# Patient Record
Sex: Female | Born: 1970 | Race: Black or African American | Hispanic: No | Marital: Married | Smoking: Never smoker
Health system: Southern US, Community
[De-identification: ages and names within clinical notes are randomized; demographics above are authoritative.]

## PROBLEM LIST (undated history)

## (undated) DIAGNOSIS — I10 Essential (primary) hypertension: Secondary | ICD-10-CM

## (undated) DIAGNOSIS — E785 Hyperlipidemia, unspecified: Secondary | ICD-10-CM

## (undated) HISTORY — DX: Hyperlipidemia, unspecified: E78.5

## (undated) HISTORY — PX: TUBAL LIGATION: SHX77

## (undated) HISTORY — DX: Essential (primary) hypertension: I10

---

## 1997-04-23 ENCOUNTER — Inpatient Hospital Stay (HOSPITAL_COMMUNITY): Admission: AD | Admit: 1997-04-23 | Discharge: 1997-04-23 | Payer: Self-pay | Admitting: Gynecology

## 1997-05-05 ENCOUNTER — Inpatient Hospital Stay (HOSPITAL_COMMUNITY): Admission: AD | Admit: 1997-05-05 | Discharge: 1997-05-05 | Payer: Self-pay | Admitting: Obstetrics and Gynecology

## 1997-05-07 ENCOUNTER — Inpatient Hospital Stay (HOSPITAL_COMMUNITY): Admission: AD | Admit: 1997-05-07 | Discharge: 1997-05-07 | Payer: Self-pay | Admitting: Obstetrics and Gynecology

## 1997-05-08 ENCOUNTER — Inpatient Hospital Stay (HOSPITAL_COMMUNITY): Admission: AD | Admit: 1997-05-08 | Discharge: 1997-05-10 | Payer: Self-pay | Admitting: Obstetrics and Gynecology

## 1998-01-20 ENCOUNTER — Emergency Department (HOSPITAL_COMMUNITY): Admission: EM | Admit: 1998-01-20 | Discharge: 1998-01-20 | Payer: Self-pay | Admitting: Internal Medicine

## 1998-01-20 ENCOUNTER — Encounter: Payer: Self-pay | Admitting: Internal Medicine

## 1999-01-27 ENCOUNTER — Emergency Department (HOSPITAL_COMMUNITY): Admission: EM | Admit: 1999-01-27 | Discharge: 1999-01-28 | Payer: Self-pay | Admitting: Emergency Medicine

## 1999-01-28 ENCOUNTER — Encounter: Payer: Self-pay | Admitting: Emergency Medicine

## 1999-07-01 ENCOUNTER — Inpatient Hospital Stay (HOSPITAL_COMMUNITY): Admission: AD | Admit: 1999-07-01 | Discharge: 1999-07-03 | Payer: Self-pay | Admitting: Gynecology

## 2000-07-18 ENCOUNTER — Encounter: Payer: Self-pay | Admitting: Emergency Medicine

## 2000-07-18 ENCOUNTER — Encounter: Admission: RE | Admit: 2000-07-18 | Discharge: 2000-07-18 | Payer: Self-pay | Admitting: Emergency Medicine

## 2001-02-02 ENCOUNTER — Emergency Department (HOSPITAL_COMMUNITY): Admission: EM | Admit: 2001-02-02 | Discharge: 2001-02-02 | Payer: Self-pay | Admitting: Emergency Medicine

## 2003-11-25 ENCOUNTER — Ambulatory Visit (HOSPITAL_COMMUNITY): Admission: RE | Admit: 2003-11-25 | Discharge: 2003-11-25 | Payer: Self-pay | Admitting: Obstetrics and Gynecology

## 2004-03-27 ENCOUNTER — Inpatient Hospital Stay (HOSPITAL_COMMUNITY): Admission: AD | Admit: 2004-03-27 | Discharge: 2004-03-29 | Payer: Self-pay | Admitting: Obstetrics and Gynecology

## 2013-03-03 ENCOUNTER — Other Ambulatory Visit: Payer: Self-pay | Admitting: Physician Assistant

## 2013-03-03 DIAGNOSIS — Z1231 Encounter for screening mammogram for malignant neoplasm of breast: Secondary | ICD-10-CM

## 2013-04-12 ENCOUNTER — Ambulatory Visit
Admission: RE | Admit: 2013-04-12 | Discharge: 2013-04-12 | Disposition: A | Discharge status: Home / Self Care | Payer: 59 | Source: Ambulatory Visit | Attending: Physician Assistant | Admitting: Physician Assistant

## 2013-04-12 DIAGNOSIS — Z1231 Encounter for screening mammogram for malignant neoplasm of breast: Secondary | ICD-10-CM

## 2014-03-21 ENCOUNTER — Other Ambulatory Visit: Payer: Self-pay

## 2014-03-21 DIAGNOSIS — Z1231 Encounter for screening mammogram for malignant neoplasm of breast: Secondary | ICD-10-CM

## 2014-04-13 ENCOUNTER — Ambulatory Visit
Admission: RE | Admit: 2014-04-13 | Discharge: 2014-04-13 | Disposition: A | Discharge status: Home / Self Care | Payer: 59 | Source: Ambulatory Visit

## 2014-04-13 DIAGNOSIS — Z1231 Encounter for screening mammogram for malignant neoplasm of breast: Secondary | ICD-10-CM

## 2015-03-19 HISTORY — PX: MOUTH SURGERY: SHX715

## 2015-03-30 ENCOUNTER — Encounter: Payer: 59 | Admitting: Neurology

## 2015-04-04 ENCOUNTER — Other Ambulatory Visit: Payer: Self-pay

## 2015-04-04 DIAGNOSIS — Z1231 Encounter for screening mammogram for malignant neoplasm of breast: Secondary | ICD-10-CM

## 2015-04-17 ENCOUNTER — Ambulatory Visit: Payer: Self-pay

## 2015-05-01 ENCOUNTER — Ambulatory Visit
Admission: RE | Admit: 2015-05-01 | Discharge: 2015-05-01 | Disposition: A | Discharge status: Home / Self Care | Payer: 59 | Source: Ambulatory Visit

## 2015-05-01 DIAGNOSIS — Z1231 Encounter for screening mammogram for malignant neoplasm of breast: Secondary | ICD-10-CM

## 2015-12-27 ENCOUNTER — Ambulatory Visit (INDEPENDENT_AMBULATORY_CARE_PROVIDER_SITE_OTHER): Payer: 59 | Admitting: Neurology

## 2015-12-27 ENCOUNTER — Encounter (INDEPENDENT_AMBULATORY_CARE_PROVIDER_SITE_OTHER): Payer: Self-pay | Admitting: Neurology

## 2015-12-27 DIAGNOSIS — Z0289 Encounter for other administrative examinations: Secondary | ICD-10-CM

## 2015-12-27 DIAGNOSIS — G5600 Carpal tunnel syndrome, unspecified upper limb: Secondary | ICD-10-CM | POA: Insufficient documentation

## 2015-12-27 DIAGNOSIS — G5603 Carpal tunnel syndrome, bilateral upper limbs: Secondary | ICD-10-CM | POA: Diagnosis not present

## 2015-12-27 NOTE — Procedures (Signed)
   NCS (NERVE CONDUCTION STUDY) WITH EMG (ELECTROMYOGRAPHY) REPORT   STUDY DATE: October 11th 2017 PATIENT NAME: Dorothy Frey DOB: Jan 10, 1971 MRN: [id number]    TECHNOLOGIST: Gearldine ShownLorraine Jones ELECTROMYOGRAPHER: Levert FeinsteinYan, Euleta Belson M.D.  CLINICAL INFORMATION:  45 years old right-handed female presented with 2 years history of progressive worsening intermittent bilateral hands paresthesia.  FINDINGS: NERVE CONDUCTION STUDY: Bilateral ulnar sensory and motor responses were normal. Bilateral median sensory responses showed moderately prolonged peak latency was well preserved snap amplitude, right worse than left.  Bilateral median motor responses showed mild to moderately prolonged distal latency, with normal C map amplitude, conduction velocity.  NEEDLE ELECTROMYOGRAPHY: Selective needle examinations were performed at right upper extremity muscles right cervical paraspinal muscles.  Needle examination of right pronator teres, biceps, triceps, deltoid was normal.  There was no spontaneous activity at right cervical paraspinal muscles, right C5-6 and 7.  IMPRESSION:   This is an abnormal study, there is electrodiagnostic evidence of median neuropathy across the wrist consistent with moderate bilateral carpal tunnel syndromes. There is no evidence of right cervical radiculopathy.    INTERPRETING PHYSICIAN:   Levert FeinsteinYan, Eliot Popper M.D. Ph.D. [facility] [address], [address] [place], [place] [address] [phone]

## 2016-01-04 ENCOUNTER — Encounter: Payer: 59 | Admitting: Neurology

## 2016-04-02 ENCOUNTER — Other Ambulatory Visit: Payer: Self-pay | Admitting: Physician Assistant

## 2016-04-02 DIAGNOSIS — Z1231 Encounter for screening mammogram for malignant neoplasm of breast: Secondary | ICD-10-CM

## 2016-05-21 ENCOUNTER — Ambulatory Visit
Admission: RE | Admit: 2016-05-21 | Discharge: 2016-05-21 | Disposition: A | Discharge status: Home / Self Care | Payer: 59 | Source: Ambulatory Visit | Attending: Physician Assistant | Admitting: Physician Assistant

## 2016-05-21 DIAGNOSIS — Z1231 Encounter for screening mammogram for malignant neoplasm of breast: Secondary | ICD-10-CM

## 2017-04-15 ENCOUNTER — Other Ambulatory Visit: Payer: Self-pay | Admitting: Physician Assistant

## 2017-04-15 DIAGNOSIS — Z139 Encounter for screening, unspecified: Secondary | ICD-10-CM

## 2017-06-03 ENCOUNTER — Ambulatory Visit: Payer: 59

## 2017-06-18 ENCOUNTER — Ambulatory Visit
Admission: RE | Admit: 2017-06-18 | Discharge: 2017-06-18 | Disposition: A | Discharge status: Home / Self Care | Payer: 59 | Source: Ambulatory Visit | Attending: Physician Assistant | Admitting: Physician Assistant

## 2017-06-18 DIAGNOSIS — Z139 Encounter for screening, unspecified: Secondary | ICD-10-CM

## 2018-04-07 ENCOUNTER — Other Ambulatory Visit: Payer: Self-pay | Admitting: Physician Assistant

## 2018-04-07 DIAGNOSIS — Z1231 Encounter for screening mammogram for malignant neoplasm of breast: Secondary | ICD-10-CM

## 2018-06-22 ENCOUNTER — Ambulatory Visit: Payer: 59

## 2018-08-03 ENCOUNTER — Ambulatory Visit: Payer: 59

## 2018-08-29 ENCOUNTER — Ambulatory Visit: Payer: 59

## 2018-09-16 ENCOUNTER — Ambulatory Visit: Payer: 59

## 2018-10-14 ENCOUNTER — Ambulatory Visit
Admission: RE | Admit: 2018-10-14 | Discharge: 2018-10-14 | Disposition: A | Discharge status: Home / Self Care | Payer: 59 | Source: Ambulatory Visit | Attending: Physician Assistant | Admitting: Physician Assistant

## 2018-10-14 ENCOUNTER — Other Ambulatory Visit: Payer: Self-pay

## 2018-10-14 DIAGNOSIS — Z1231 Encounter for screening mammogram for malignant neoplasm of breast: Secondary | ICD-10-CM

## 2018-10-15 ENCOUNTER — Other Ambulatory Visit: Payer: Self-pay | Admitting: Physician Assistant

## 2018-10-15 DIAGNOSIS — R928 Other abnormal and inconclusive findings on diagnostic imaging of breast: Secondary | ICD-10-CM

## 2018-10-20 ENCOUNTER — Other Ambulatory Visit: Payer: Self-pay

## 2018-10-20 ENCOUNTER — Ambulatory Visit
Admission: RE | Admit: 2018-10-20 | Discharge: 2018-10-20 | Disposition: A | Discharge status: Home / Self Care | Payer: 59 | Source: Ambulatory Visit | Attending: Physician Assistant | Admitting: Physician Assistant

## 2018-10-20 DIAGNOSIS — R928 Other abnormal and inconclusive findings on diagnostic imaging of breast: Secondary | ICD-10-CM

## 2019-06-07 ENCOUNTER — Other Ambulatory Visit: Payer: Self-pay | Admitting: Physician Assistant

## 2019-06-07 DIAGNOSIS — Z1231 Encounter for screening mammogram for malignant neoplasm of breast: Secondary | ICD-10-CM

## 2019-10-15 ENCOUNTER — Other Ambulatory Visit: Payer: Self-pay

## 2019-10-15 ENCOUNTER — Ambulatory Visit
Admission: RE | Admit: 2019-10-15 | Discharge: 2019-10-15 | Disposition: A | Discharge status: Home / Self Care | Payer: 59 | Source: Ambulatory Visit | Attending: Physician Assistant | Admitting: Physician Assistant

## 2019-10-15 DIAGNOSIS — Z1231 Encounter for screening mammogram for malignant neoplasm of breast: Secondary | ICD-10-CM

## 2019-11-18 ENCOUNTER — Other Ambulatory Visit: Payer: Self-pay

## 2019-11-18 ENCOUNTER — Ambulatory Visit
Admission: RE | Admit: 2019-11-18 | Discharge: 2019-11-18 | Disposition: A | Discharge status: Home / Self Care | Payer: 59 | Source: Ambulatory Visit | Attending: Physician Assistant | Admitting: Physician Assistant

## 2019-11-18 ENCOUNTER — Ambulatory Visit: Payer: 59

## 2019-11-24 ENCOUNTER — Other Ambulatory Visit: Payer: Self-pay | Admitting: Physician Assistant

## 2019-11-24 DIAGNOSIS — R928 Other abnormal and inconclusive findings on diagnostic imaging of breast: Secondary | ICD-10-CM

## 2019-12-07 ENCOUNTER — Other Ambulatory Visit: Payer: 59

## 2019-12-16 ENCOUNTER — Ambulatory Visit
Admission: RE | Admit: 2019-12-16 | Discharge: 2019-12-16 | Disposition: A | Discharge status: Home / Self Care | Payer: 59 | Source: Ambulatory Visit | Attending: Physician Assistant | Admitting: Physician Assistant

## 2019-12-16 ENCOUNTER — Other Ambulatory Visit: Payer: Self-pay

## 2019-12-16 DIAGNOSIS — R928 Other abnormal and inconclusive findings on diagnostic imaging of breast: Secondary | ICD-10-CM

## 2020-05-26 ENCOUNTER — Other Ambulatory Visit: Payer: Self-pay

## 2020-05-26 ENCOUNTER — Ambulatory Visit (AMBULATORY_SURGERY_CENTER): Payer: 59

## 2020-05-26 VITALS — Ht 65.5 in | Wt 235.0 lb

## 2020-05-26 DIAGNOSIS — Z1211 Encounter for screening for malignant neoplasm of colon: Secondary | ICD-10-CM

## 2020-05-26 MED ORDER — PLENVU 140 G PO SOLR: 1.0000 | ORAL | 0 refills | Status: DC

## 2020-05-26 NOTE — Progress Notes (Signed)
Pre visit completed via phone call;  Patient verified name, DOB, and address; No egg or soy allergy known to patient  No issues with past sedation with any surgeries or procedures No intubation problems in the past  No FH of Malignant Hyperthermia No diet pills per patient No home 02 use per patient  No blood thinners per patient  Pt denies issues with constipation  No A fib or A flutter  EMMI video via MyChart  COVID 19 guidelines implemented in PV today with Pt and RN  Coupon given to pt in PV today, Code to Pharmacy and  NO PA's for preps discussed with pt in PV today  Discussed with pt there will be an out-of-pocket cost for prep and that varies from $0 to 70 dollars  Due to the COVID-19 pandemic we are asking patients to follow certain guidelines.   Pt aware of COVID protocols and [facility] guidelines;  Patient reports missing teeth; patient dentures, partials, dental implants, capped or bonded teeth;

## 2020-05-29 ENCOUNTER — Encounter: Payer: Self-pay | Admitting: Gastroenterology

## 2020-06-09 ENCOUNTER — Ambulatory Visit (AMBULATORY_SURGERY_CENTER): Payer: 59 | Admitting: Gastroenterology

## 2020-06-09 ENCOUNTER — Encounter: Payer: Self-pay | Admitting: Gastroenterology

## 2020-06-09 ENCOUNTER — Other Ambulatory Visit: Payer: Self-pay

## 2020-06-09 VITALS — BP 124/76 | HR 81 | Temp 97.3°F | Resp 16 | Ht 65.0 in | Wt 235.0 lb

## 2020-06-09 DIAGNOSIS — Z1211 Encounter for screening for malignant neoplasm of colon: Secondary | ICD-10-CM

## 2020-06-09 MED ORDER — SODIUM CHLORIDE 0.9 % IV SOLN: 500.0000 mL | Freq: Once | INTRAVENOUS | Status: AC

## 2020-06-09 NOTE — Progress Notes (Signed)
VS by CW  Pt's states no medical or surgical changes since previsit or office visit.  

## 2020-06-09 NOTE — Patient Instructions (Signed)
Impression/Recommendations:  Diverticulosis handout given to patient.  Resume previous diet. Continue present medications.  Repeat colonoscopy in 10 years for screening.  YOU HAD AN ENDOSCOPIC PROCEDURE TODAY AT THE [facility]:   Refer to the procedure report that was given to you for any specific questions about what was found during the examination.  If the procedure report does not answer your questions, please call your gastroenterologist to clarify.  If you requested that your care partner not be given the details of your procedure findings, then the procedure report has been included in a sealed envelope for you to review at your convenience later.  YOU SHOULD EXPECT: Some feelings of bloating in the abdomen. Passage of more gas than usual.  Walking can help get rid of the air that was put into your GI tract during the procedure and reduce the bloating. If you had a lower endoscopy (such as a colonoscopy or flexible sigmoidoscopy) you may notice spotting of blood in your stool or on the toilet paper. If you underwent a bowel prep for your procedure, you may not have a normal bowel movement for a few days.  Please Note:  You might notice some irritation and congestion in your nose or some drainage.  This is from the oxygen used during your procedure.  There is no need for concern and it should clear up in a day or so.  SYMPTOMS TO REPORT IMMEDIATELY:   Following lower endoscopy (colonoscopy or flexible sigmoidoscopy):  Excessive amounts of blood in the stool  Significant tenderness or worsening of abdominal pains  Swelling of the abdomen that is new, acute  Fever of 100F or higher For urgent or emergent issues, a gastroenterologist can be reached at any hour by calling [phone]. Do not use MyChart messaging for urgent concerns.    DIET:  We do recommend a small meal at first, but then you may proceed to your regular diet.  Drink plenty of fluids but you should  avoid alcoholic beverages for 24 hours.  ACTIVITY:  You should plan to take it easy for the rest of today and you should NOT DRIVE or use heavy machinery until tomorrow (because of the sedation medicines used during the test).    FOLLOW UP: Our staff will call the number listed on your records 48-72 hours following your procedure to check on you and address any questions or concerns that you may have regarding the information given to you following your procedure. If we do not reach you, we will leave a message.  We will attempt to reach you two times.  During this call, we will ask if you have developed any symptoms of COVID 19. If you develop any symptoms (ie: fever, flu-like symptoms, shortness of breath, cough etc.) before then, please call [phone].  If you test positive for Covid 19 in the 2 weeks post procedure, please call and report this information to us.    If any biopsies were taken you will be contacted by phone or by letter within the next 1-3 weeks.  Please call us at [phone] if you have not heard about the biopsies in 3 weeks.    SIGNATURES/CONFIDENTIALITY: You and/or your care partner have signed paperwork which will be entered into your electronic medical record.  These signatures attest to the fact that that the information above on your After Visit Summary has been reviewed and is understood.  Full responsibility of the confidentiality of this discharge information lies with you   and/or your care-partner. 

## 2020-06-09 NOTE — Progress Notes (Signed)
To PACU, vss. Report to [email or web address] 

## 2020-06-09 NOTE — Op Note (Signed)
[facility] Patient Name: Dorothy Frey Procedure Date: 06/09/2020 7:42 AM MRN: [id number] Endoscopist: Rachael Fee , MD Age: 50 Referring MD:  Date of Birth: 1971-02-25 Gender: Female Account #: [id number] Procedure:                Colonoscopy Indications:              Screening for colorectal malignant neoplasm Medicines:                Monitored Anesthesia Care Procedure:                Pre-Anesthesia Assessment:                           - Prior to the procedure, a History and Physical                            was performed, and patient medications and                            allergies were reviewed. The patient's tolerance of                            previous anesthesia was also reviewed. The risks                            and benefits of the procedure and the sedation                            options and risks were discussed with the patient.                            All questions were answered, and informed consent                            was obtained. Prior Anticoagulants: The patient has                            taken no previous anticoagulant or antiplatelet                            agents. ASA Grade Assessment: III - A patient with                            severe systemic disease. After reviewing the risks                            and benefits, the patient was deemed in                            satisfactory condition to undergo the procedure.                           After obtaining informed consent, the colonoscope  was passed under direct vision. Throughout the                            procedure, the patient's blood pressure, pulse, and                            oxygen saturations were monitored continuously. The                            Olympus CF-HQ190 [id number]) Colonoscope was                            introduced through the anus and advanced to the the                            cecum,  identified by appendiceal orifice and                            ileocecal valve. The colonoscopy was performed                            without difficulty. The patient tolerated the                            procedure well. The quality of the bowel                            preparation was good. The ileocecal valve,                            appendiceal orifice, and rectum were photographed. Scope In: 7:55:43 AM Scope Out: 8:06:48 AM Scope Withdrawal Time: 0 hours 8 minutes 47 seconds  Total Procedure Duration: 0 hours 11 minutes 5 seconds  Findings:                 Multiple small and large-mouthed diverticula were                            found in the entire colon.                           The exam was otherwise without abnormality on                            direct and retroflexion views. Complications:            No immediate complications. Estimated blood loss:                            None. Estimated Blood Loss:     Estimated blood loss: none. Impression:               - Diverticulosis in the entire examined colon.                           - The examination was otherwise normal  on direct                            and retroflexion views.                           - No specimens collected. Recommendation:           - Patient has a contact number available for                            emergencies. The signs and symptoms of potential                            delayed complications were discussed with the                            patient. Return to normal activities tomorrow.                            Written discharge instructions were provided to the                            patient.                           - Resume previous diet.                           - Continue present medications.                           - Repeat colonoscopy in 10 years for screening. Rachael Fee, MD 06/09/2020 8:08:13 AM This report has been signed electronically.

## 2020-06-13 ENCOUNTER — Telehealth: Payer: Self-pay | Admitting: *Deleted

## 2020-06-13 NOTE — Telephone Encounter (Signed)
Attempted 2nd f/u phone call. No answer. Left message.  °

## 2022-01-10 IMAGING — MG DIGITAL SCREENING BILAT W/ TOMO W/ CAD
8 series · 8 of 24 positions shown · non-contrast
Comparison: Previous exam(s).

CLINICAL DATA: Screening.

EXAM:
DIGITAL SCREENING BILATERAL MAMMOGRAM WITH TOMO AND CAD

[L MLO synth-2D]
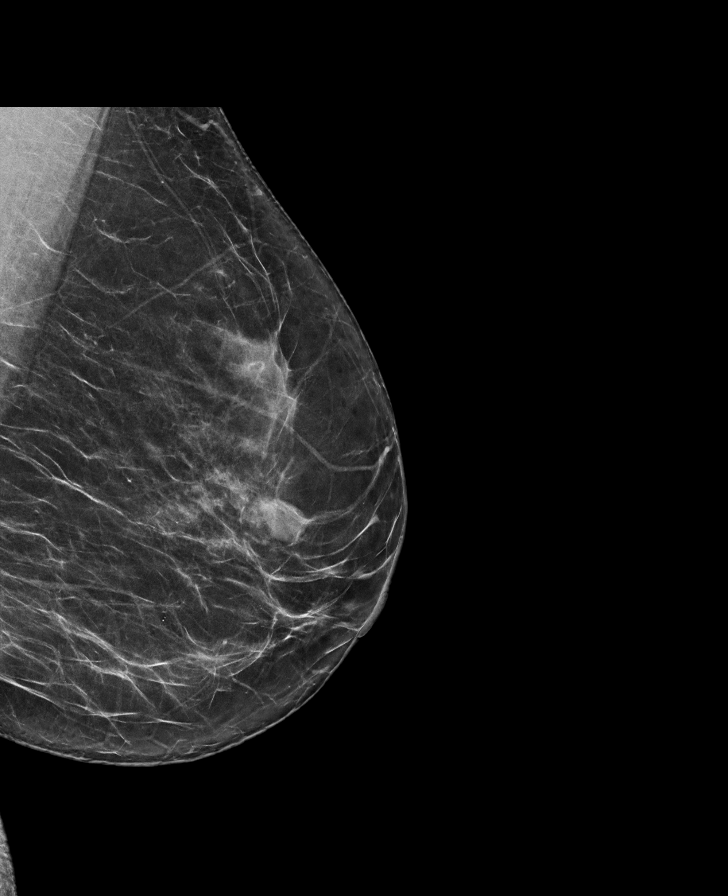

[R CC synth-2D]
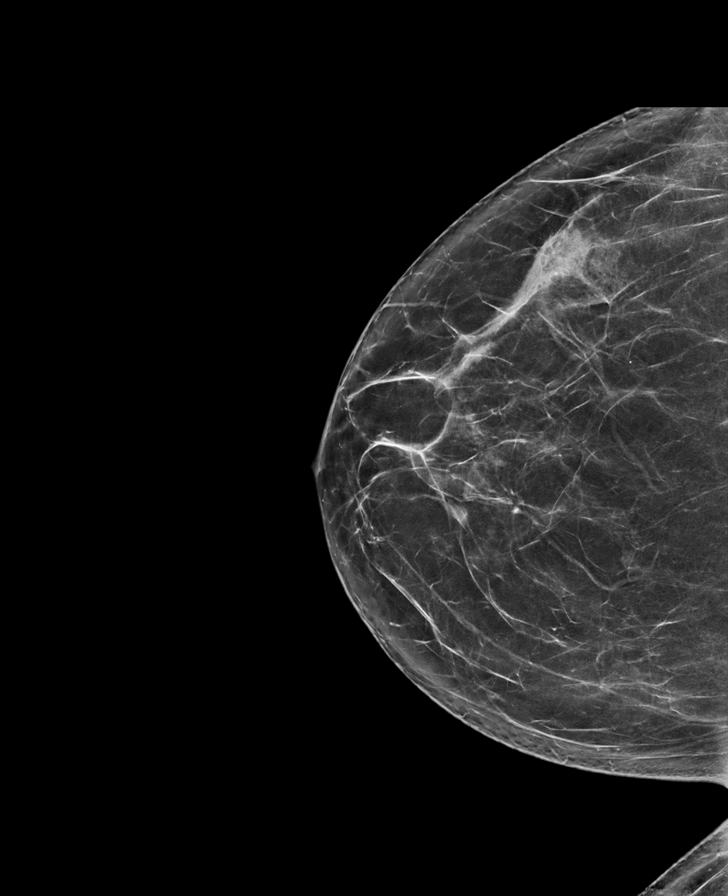

[R MLO synth-2D]
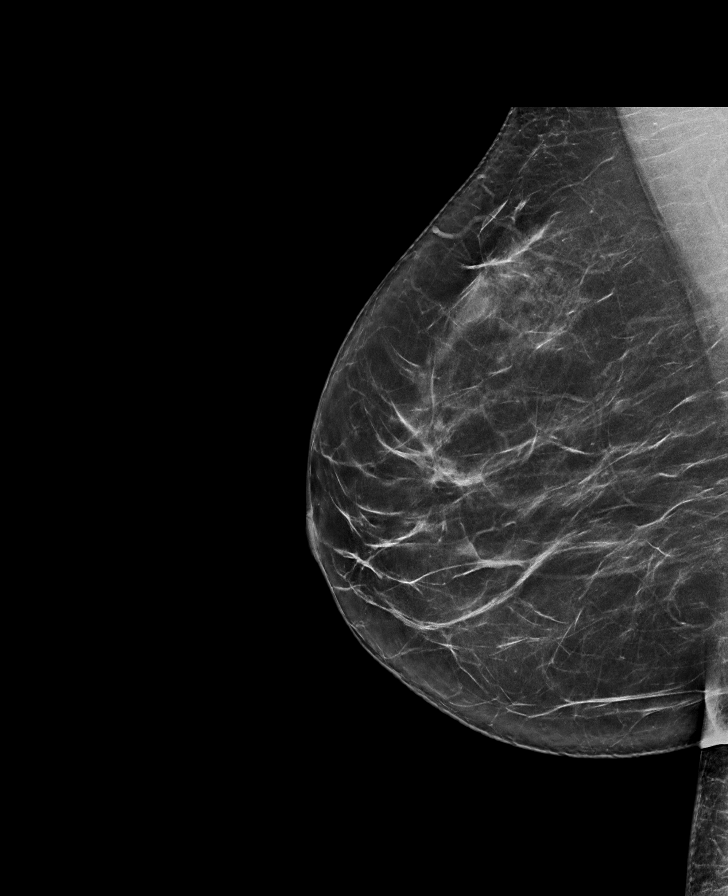

[L CC synth-2D]
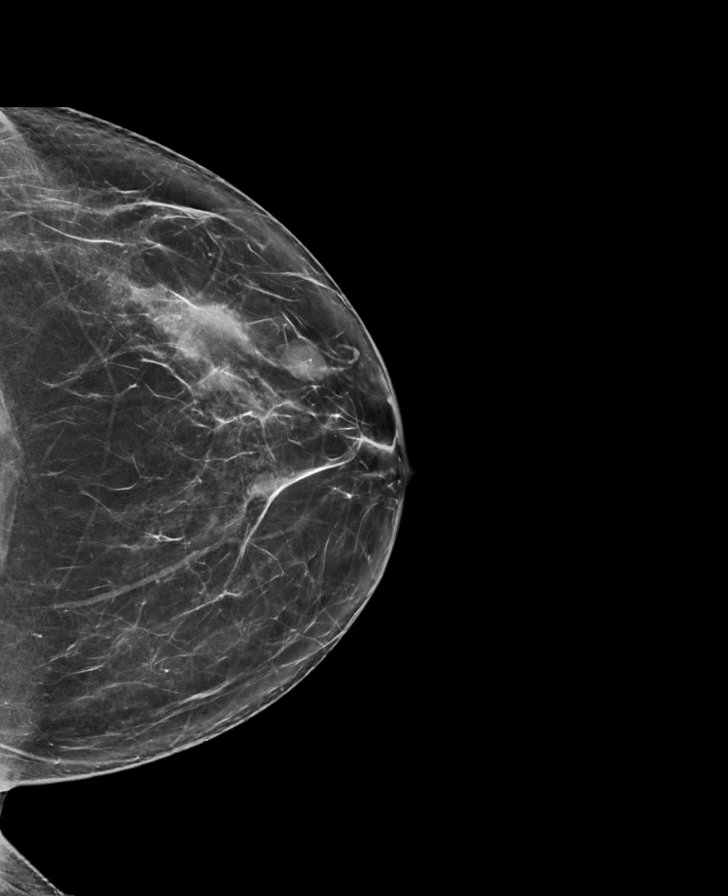

[R MLO tomo · tomo slice 41/80.0]
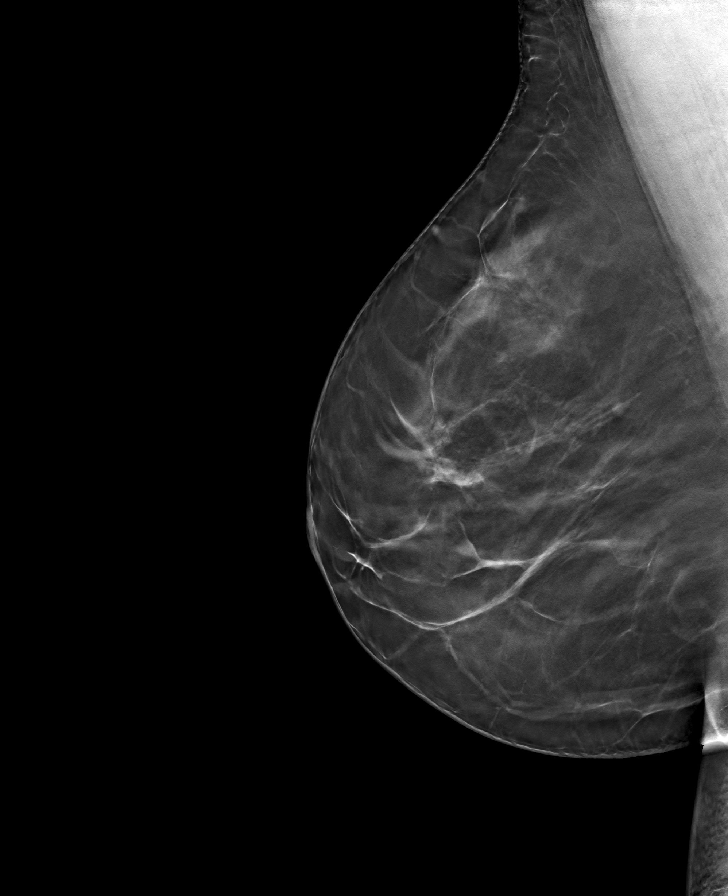

[L CC tomo · tomo slice 39/77.0]
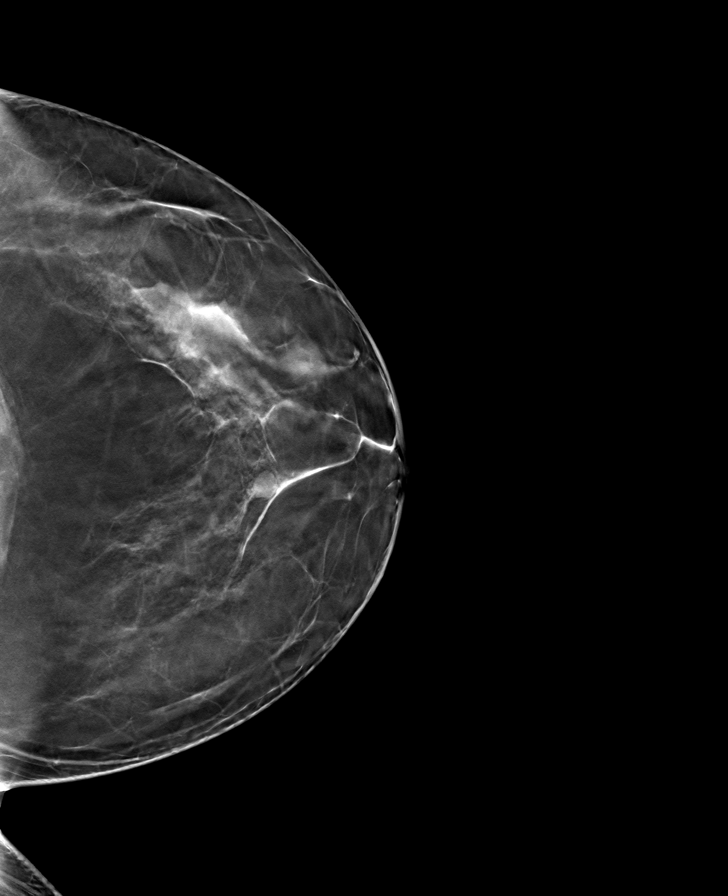

[L MLO tomo · tomo slice 38/75.0]
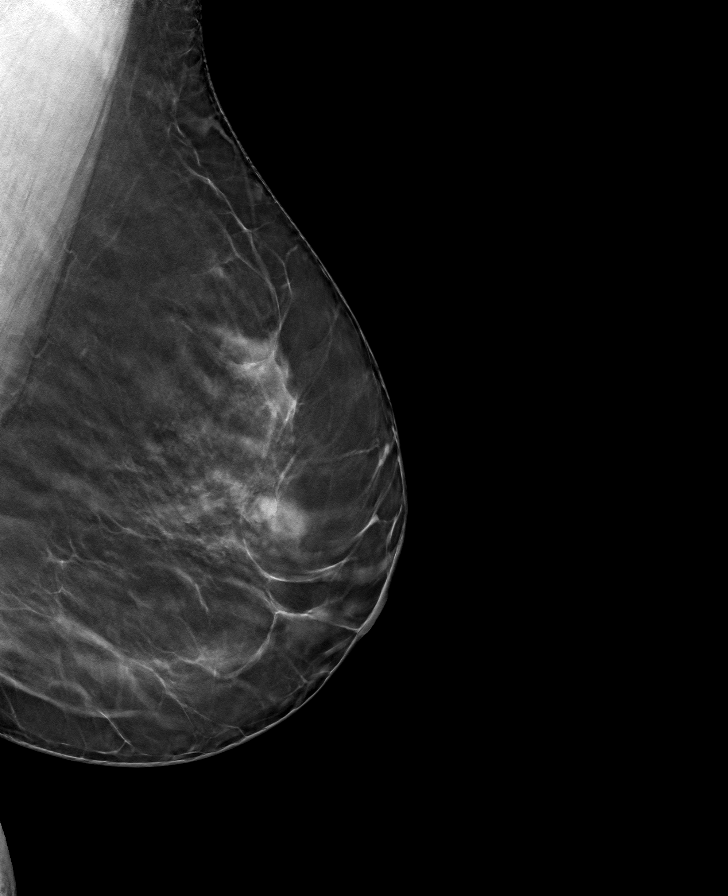

[R CC tomo · tomo slice 39/76.0]
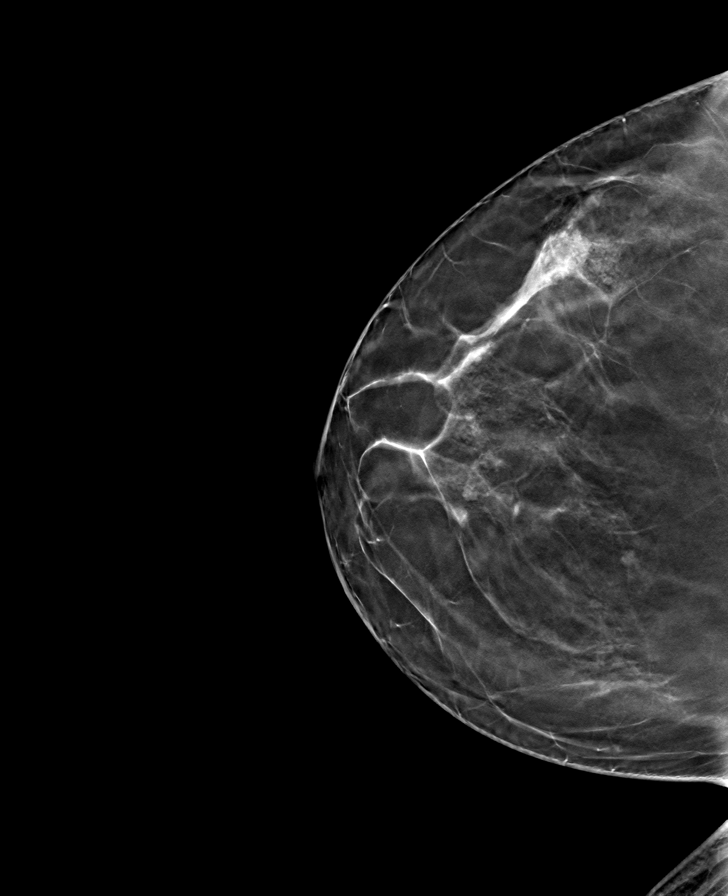

[8 of 24 positions shown; findings below may reference images not displayed]

ACR Breast Density Category b: There are scattered areas of
fibroglandular density.
FINDINGS: In the left breast, a possible mass warrants further evaluation. In
the right breast, no findings suspicious for malignancy. Images were
processed with CAD.
IMPRESSION: Further evaluation is suggested for possible mass in the left
breast.

RECOMMENDATION:
Ultrasound of the left breast. (Code:F4-8-550)

The patient will be contacted regarding the findings, and additional
imaging will be scheduled.

BI-RADS CATEGORY  0: Incomplete. Need additional imaging evaluation
and/or prior mammograms for comparison.

## 2022-02-07 IMAGING — US US BREAST*L* LIMITED INC AXILLA
1 series · 13 of 13 positions shown · non-contrast
Comparison: Previous exam(s).

CLINICAL DATA: Recall from screening mammography with
tomosynthesis, new mass involving the UPPER LEFT breast at ANTERIOR
to MIDDLE depth. Personal history of LEFT breast cysts.

EXAM:
ULTRASOUND OF THE LEFT BREAST

[Series 1: us breast*left* limited inc axilla · 0.07mm/px · 13 of 13 slices shown]
[im 1/13]
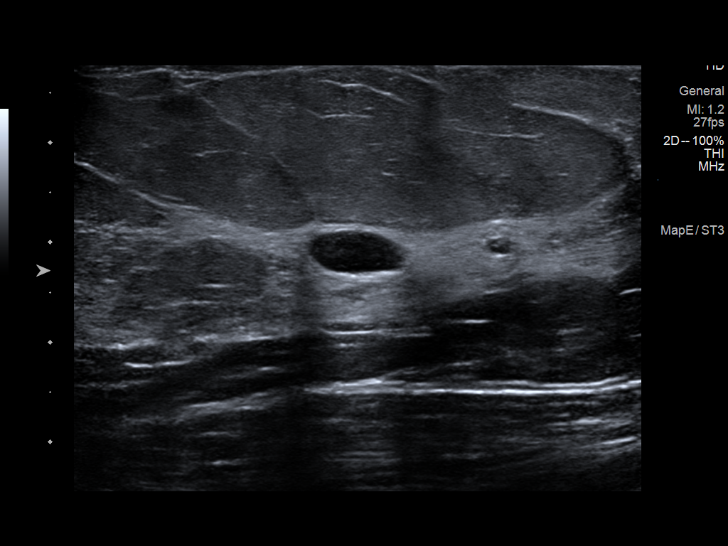
[im 2/13]
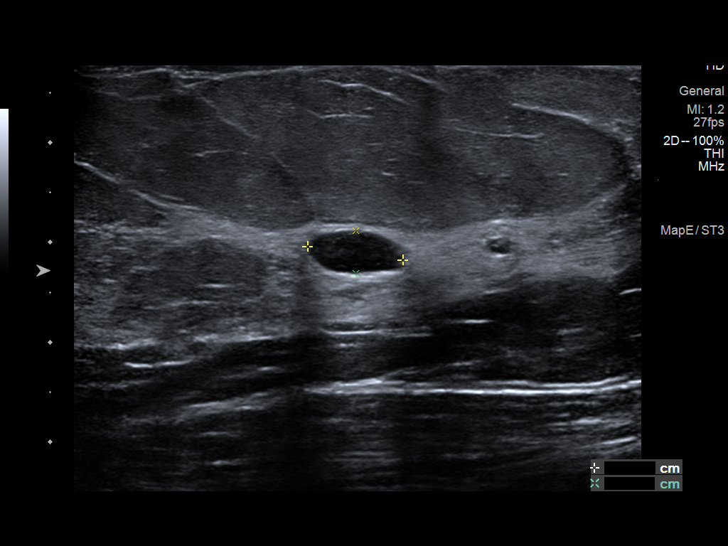
[im 3/13]
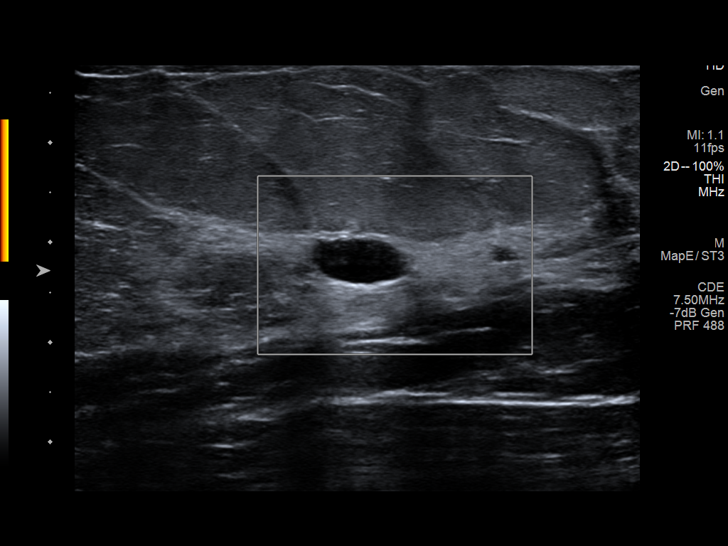
[im 4/13]
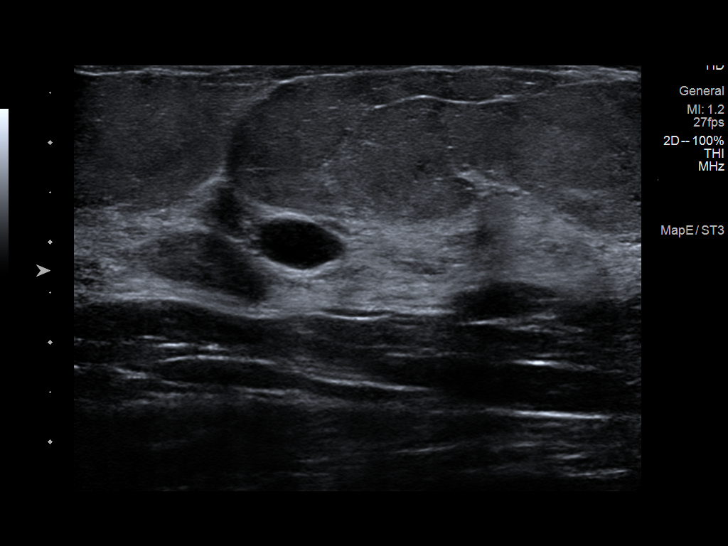
[im 5/13]
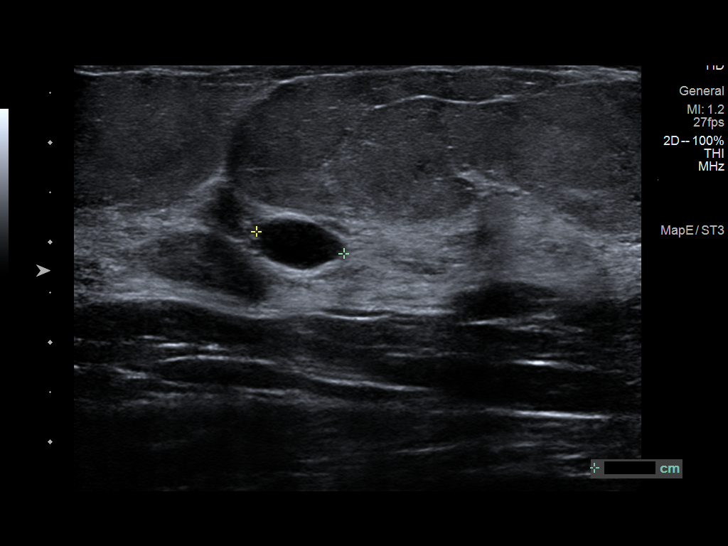
[im 6/13]
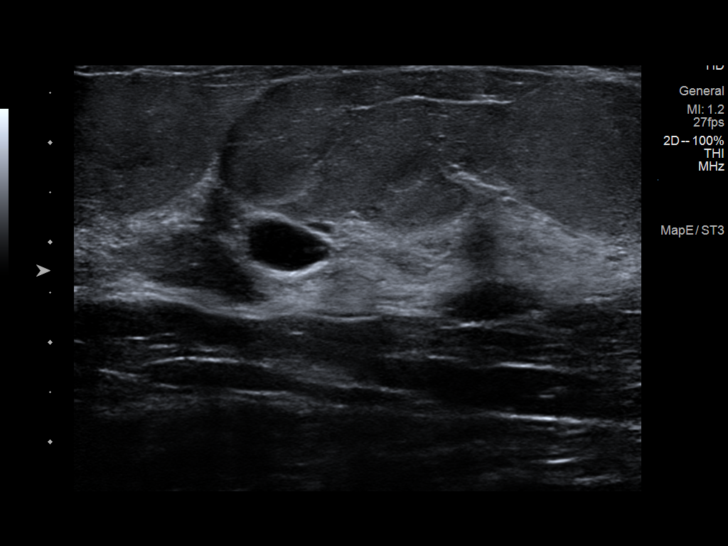
[im 7/13]
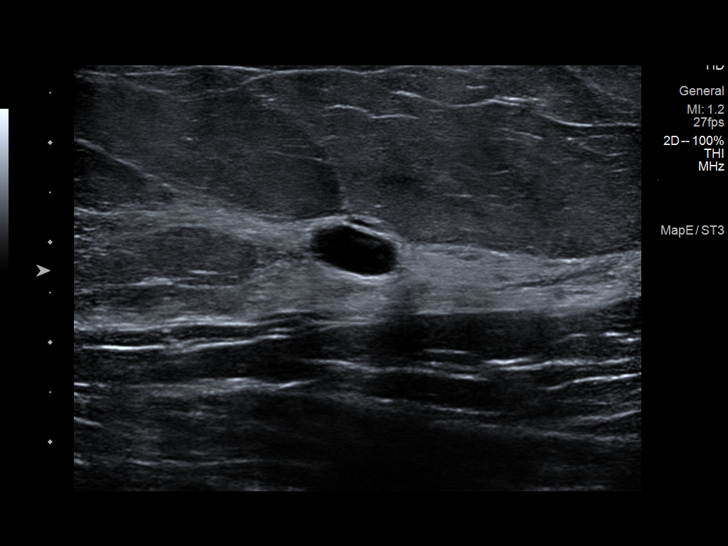
[im 8/13]
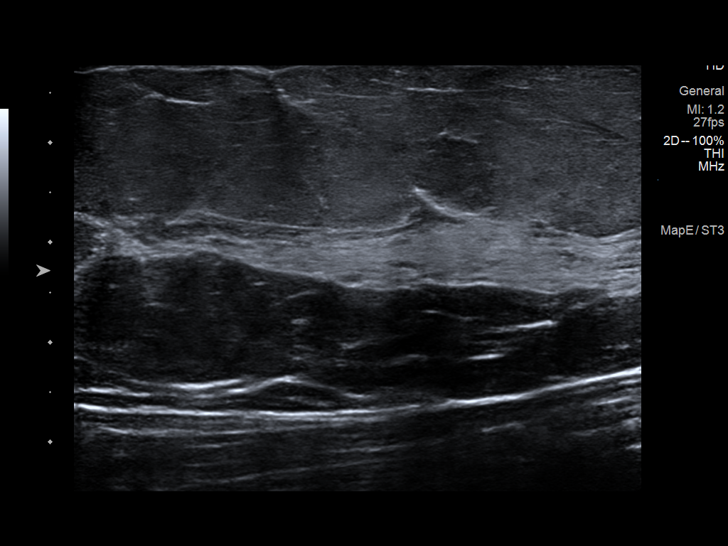
[im 9/13]
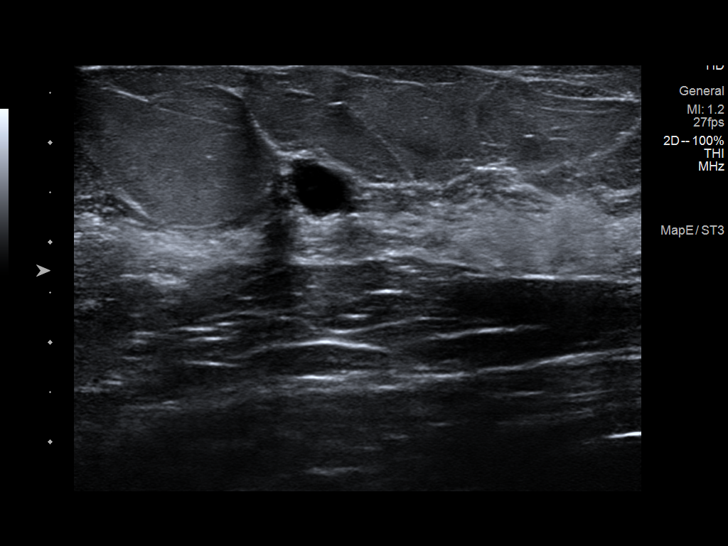
[im 10/13]
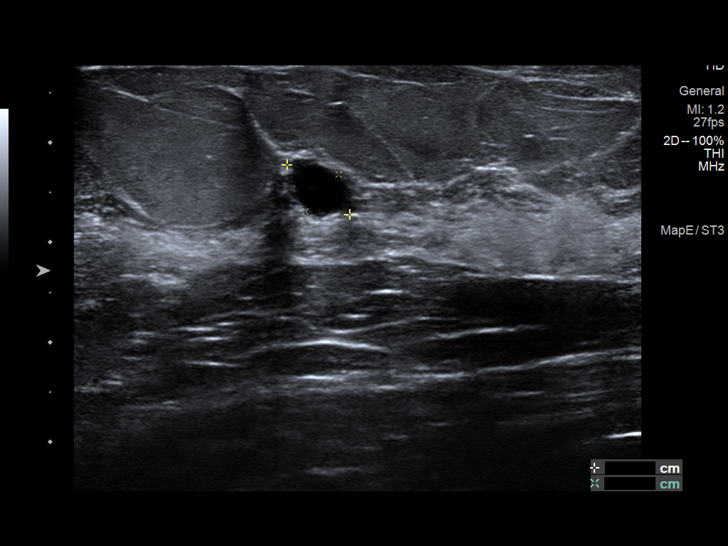
[im 11/13]
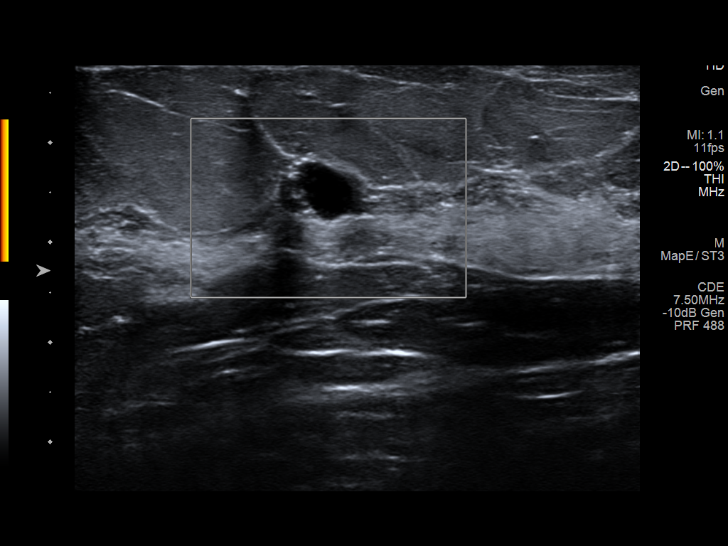
[im 12/13]
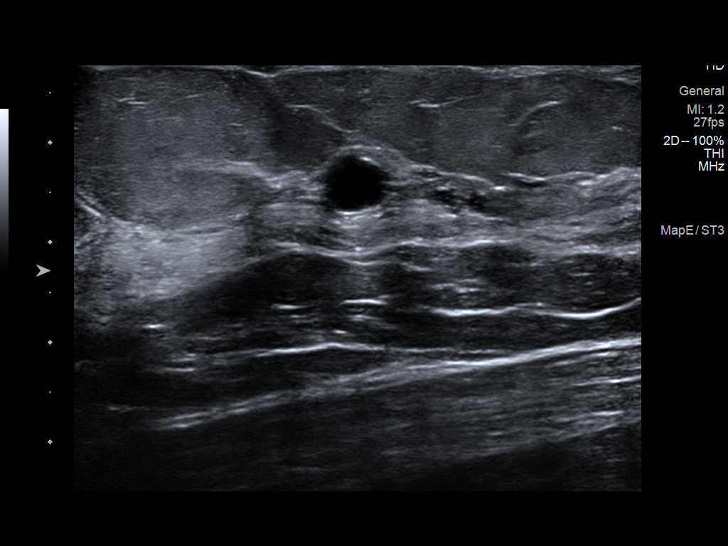
[im 13/13]
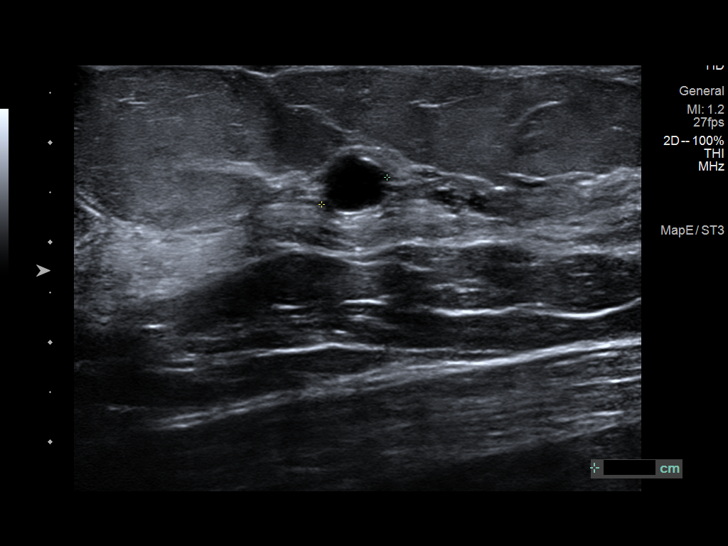

[13 of 13 positions shown; findings below may reference images not displayed]

FINDINGS: Targeted ultrasound is performed, showing a benign simple cyst at
the 11:30 o'clock position approximately 3 cm from nipple at MIDDLE
depth, measuring approximately 1.0 x 0.4 x 0.9 cm, demonstrating
posterior acoustic enhancement and no internal power Doppler flow,
corresponding to the screening mammographic finding.

Incidental note is made of a simple cyst at the 1 o'clock position
approximately 4 cm from the nipple at ANTERIOR depth measuring
approximately 0.8 x 0.4 x 0.7 cm.

No suspicious solid mass or abnormal acoustic shadowing is
identified.
IMPRESSION: Benign cysts in the UPPER LEFT breast, one of which corresponds to
the screening mammographic finding.

RECOMMENDATION:
Screening mammogram in one year.(Code:AT-K-S0H)

I have discussed the findings and recommendations with the patient.
If applicable, a reminder letter will be sent to the patient
regarding the next appointment.

BI-RADS CATEGORY  2: Benign.

## 2023-10-03 ENCOUNTER — Encounter: Payer: Self-pay | Admitting: Advanced Practice Midwife
